# Patient Record
Sex: Female | Born: 1995 | Race: White | Hispanic: No | Marital: Single | Smoking: Never smoker
Health system: Southern US, Community
[De-identification: ages and names within clinical notes are randomized; demographics above are authoritative.]

---
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
## 2016-12-11 ENCOUNTER — Encounter (INDEPENDENT_AMBULATORY_CARE_PROVIDER_SITE_OTHER): Payer: Self-pay | Admitting: Orthopedic Surgery

## 2016-12-11 ENCOUNTER — Ambulatory Visit (INDEPENDENT_AMBULATORY_CARE_PROVIDER_SITE_OTHER): Payer: 59

## 2016-12-11 ENCOUNTER — Telehealth (INDEPENDENT_AMBULATORY_CARE_PROVIDER_SITE_OTHER): Payer: Self-pay | Admitting: Orthopedic Surgery

## 2016-12-11 ENCOUNTER — Ambulatory Visit (INDEPENDENT_AMBULATORY_CARE_PROVIDER_SITE_OTHER): Payer: 59 | Admitting: Orthopedic Surgery

## 2016-12-11 DIAGNOSIS — M25572 Pain in left ankle and joints of left foot: Secondary | ICD-10-CM

## 2016-12-11 NOTE — Progress Notes (Signed)
Office Visit Note   Patient: Wendy Manning           Date of Birth: 1996/02/06           MRN: [id number] Visit Date: 12/11/2016 Requested by: No referring provider defined for this encounter. PCP: Allison QuarryLouise A Twiselton, MD  Subjective: Chief Complaint  Patient presents with  . Left Ankle - Pain, Injury    HPI patient is a 20 year old female with left ankle pain.  She turned it 10/23/2016.  It has kept hurting.  Start her to run is hard for her to walk.  Localizes pain in the lateral ankle region.  Subluxed softball with the initial event.  She's had 2 prior severe ankle sprains on the left-hand side.  She states that the ankle "feel shifting".  She is going to play ultimate Frisbee at [employer] in the spring.  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              Review of Systems All systems reviewed are negative as they relate to the chief complaint within the history of present illness.  Patient denies  fevers or chills.    Assessment & Plan: Visit Diagnoses:  1. Acute left ankle pain     Plan: Impression is left ankle pain status post sprain with persistent symptoms and the possibility of a chondral defect.  Plan is to get an MRI scan of the left ankle to rule out chondral defect.  She's had some degree of injury to the anterior talofibular ligament but it doesn't feel excessively unstable on exam to varus tilt testing or anterior drawer testing.  We'll see her back after that study  Follow-Up Instructions: No Follow-up on file.   Orders:  Orders Placed This Encounter  Procedures  . XR Ankle Complete Left   No orders of the defined types were placed in this encounter.     Procedures: No procedures performed   Clinical Data: No additional findings.  Objective: Vital Signs: There were no vitals taken for this visit.  Physical Exam   Constitutional: Patient appears well-developed HEENT:  Head: Normocephalic Eyes:EOM are normal Neck: Normal range of  motion Cardiovascular: Normal rate Pulmonary/chest: Effort normal Neurologic: Patient is alert Skin: Skin is warm Psychiatric: Patient has normal mood and affect    Ortho Exam examination of the left ankle demonstrates a little swelling around the anterior distal fibula.  She has palpable nontender intact anterior to posterior tib peroneal and Achilles tendons.  She doesn't have much tenderness over the peroneal tendons.  No tenderness at the base of fifth metatarsal on the left.  No pain with pronation supination of the forefoot.  No other masses lymph adenopathy or skin changes noted in the left ankle region.  Specialty Comments:  No specialty comments available.  Imaging: Xr Ankle Complete Left  Result Date: 12/11/2016 AP mortise lateral left ankle reviewed.  Bones normal.  No fracture present.  Mortise is symmetric and aligned.  No soft tissue calcifications    PMFS History: There are no active problems to display for this patient.  No past medical history on file.  No family history on file.  No past surgical history on file. Social History   Occupational History  . Not on file.   Social History Main Topics  . Smoking status: Never Smoker  . Smokeless tobacco: Not on file  . Alcohol use Not on file  . Drug use: Unknown  . Sexual activity: Not  on file

## 2016-12-11 NOTE — Telephone Encounter (Signed)
Done and already scheduled

## 2016-12-11 NOTE — Telephone Encounter (Signed)
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  Can we get the MRI scheduled and review prior to this date?  No appt available @ this time for Dean's schedule.  I told her that you will be following up with her later.

## 2016-12-20 ENCOUNTER — Ambulatory Visit
Admission: RE | Admit: 2016-12-20 | Discharge: 2016-12-20 | Disposition: A | Discharge status: Home / Self Care | Payer: 59 | Source: Ambulatory Visit | Attending: Orthopedic Surgery | Admitting: Orthopedic Surgery

## 2016-12-20 DIAGNOSIS — M25572 Pain in left ankle and joints of left foot: Secondary | ICD-10-CM

## 2016-12-26 ENCOUNTER — Telehealth (INDEPENDENT_AMBULATORY_CARE_PROVIDER_SITE_OTHER): Payer: Self-pay | Admitting: Orthopedic Surgery

## 2016-12-26 NOTE — Telephone Encounter (Signed)
Please see MRI report, pt is calling for results.  No appt is made with you.  Please advise on followup for patient?  Let me know if I need to call her or if you are calling her, thanks.

## 2016-12-26 NOTE — Telephone Encounter (Signed)
Patient requesting the results of MRI on L ankle.  Was done 12/20/16 @ [facility]

## 2016-12-30 NOTE — Telephone Encounter (Signed)
I called the patient.  I think she's probably going to need surgery.  This is a long-standing issue and she is feeling some shifting when she walks around.  Please make an appointment with her to see Dr. Lajoyce Cornersuda on May 15 or mid-May thank you

## 2017-01-02 NOTE — Telephone Encounter (Signed)
IC pt and LMVM to call our office to schedule an appt mid-[place] with Dr Lajoyce Cornersuda per Dr August Saucerean

## 2017-08-04 IMAGING — MR MR ANKLE*L* W/O CM
3 of 5 series · 8 of 40 positions shown · non-contrast
Comparison: Radiographs 12/11/2016

CLINICAL DATA: Persistent left ankle pain related to an ankle
injury playing soccer 2 months ago. Lateral sided ankle pain and
weakness.

EXAM:
MRI OF THE LEFT ANKLE WITHOUT CONTRAST
TECHNIQUE: Multiplanar, multisequence MR imaging of the ankle was performed. No
intravenous contrast was administered.

[Series 3: PD fat-sat · axial · left · 4.0mm · 0.20mm/px · z∈[-18,+78]mm · 3 of 30 slices shown]
[im 5/30]
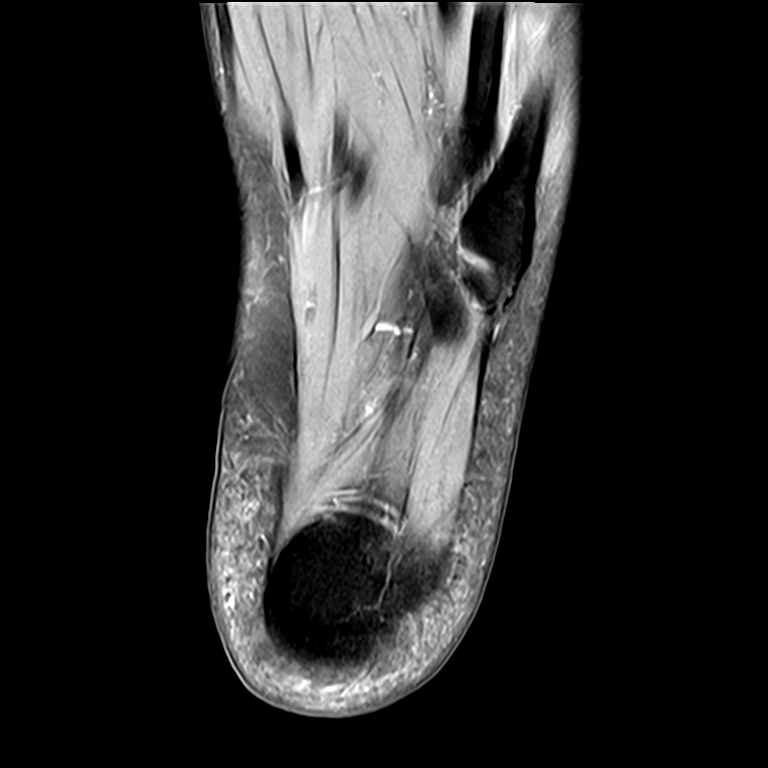
[im 17/30]
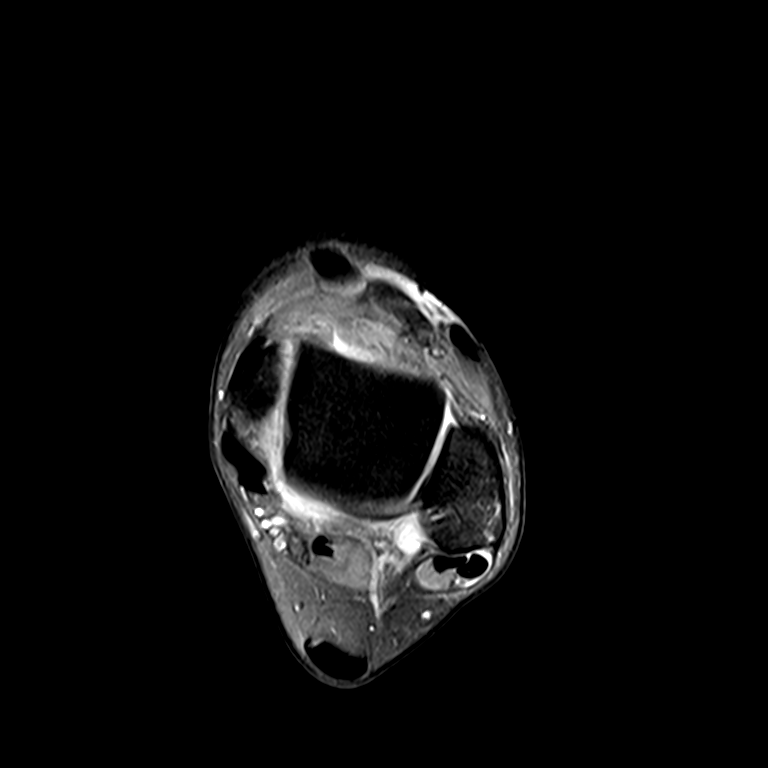
[im 25/30]
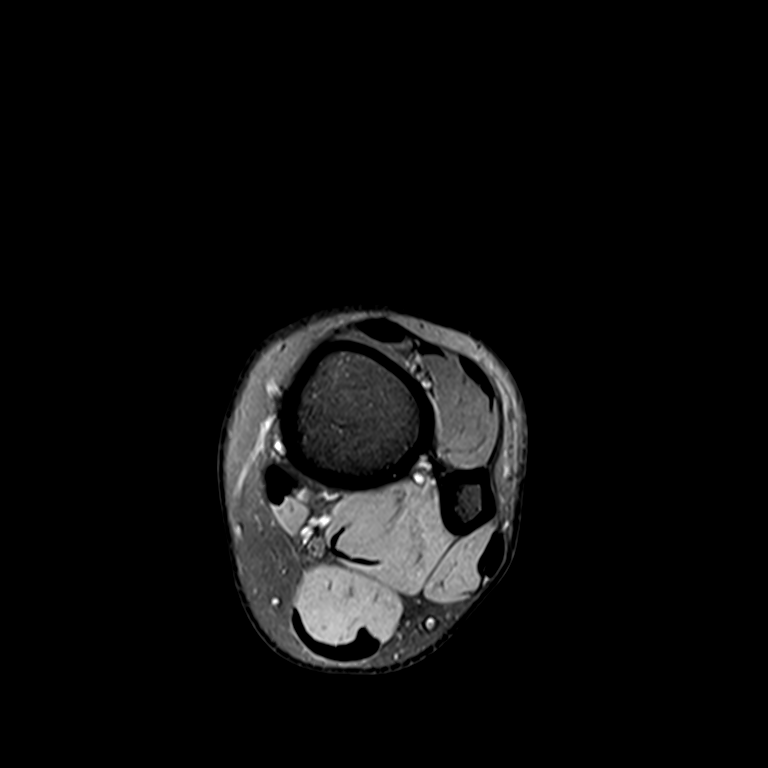

[Series 4: T2 fat-sat · axial · left · 4.0mm · 0.20mm/px · z∈[-22,+83]mm · 3 of 30 slices shown (1 of 2)]
[im 4/30]
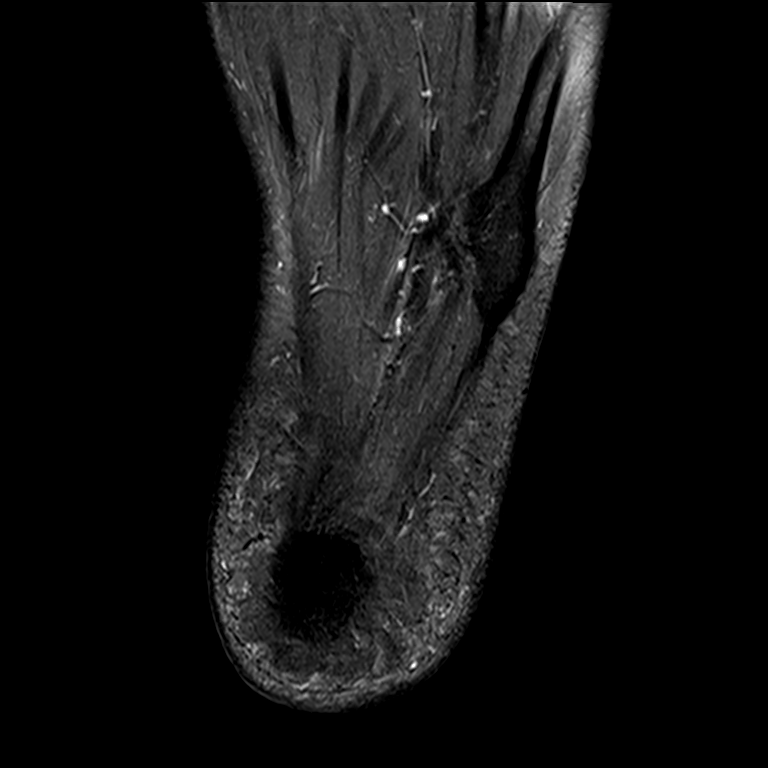
[im 15/30]
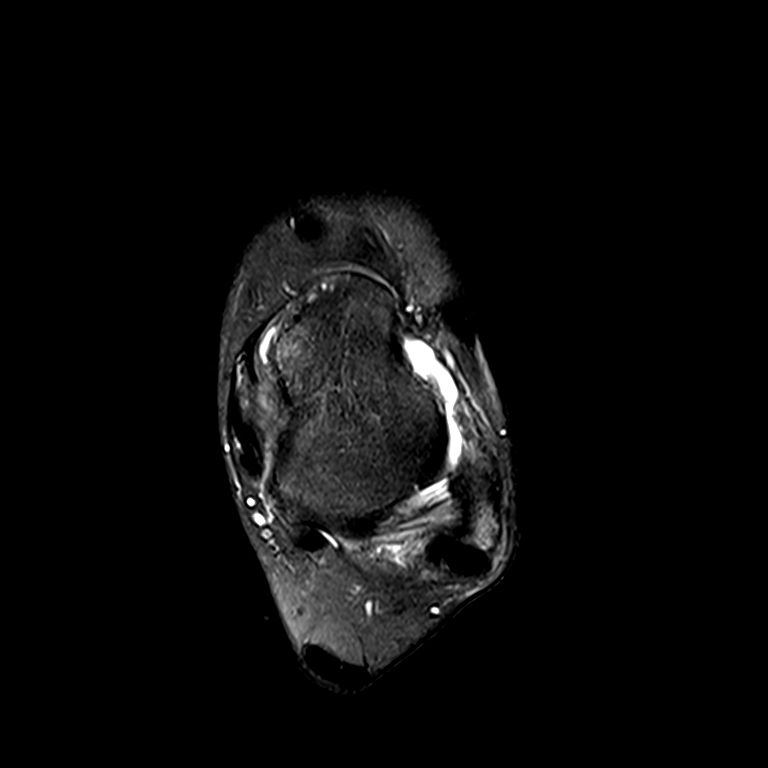
[im 26/30]
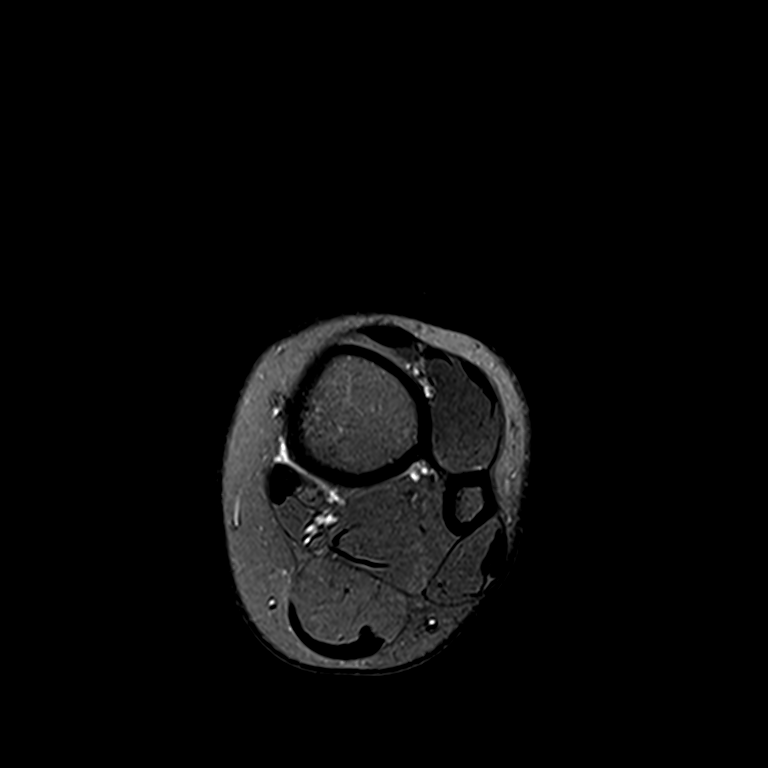

[Series 5: T2 fat-sat · sagittal · left · 3.0mm · 0.21mm/px · 2 of 24 slices shown (2 of 2)]
[im 4/24]
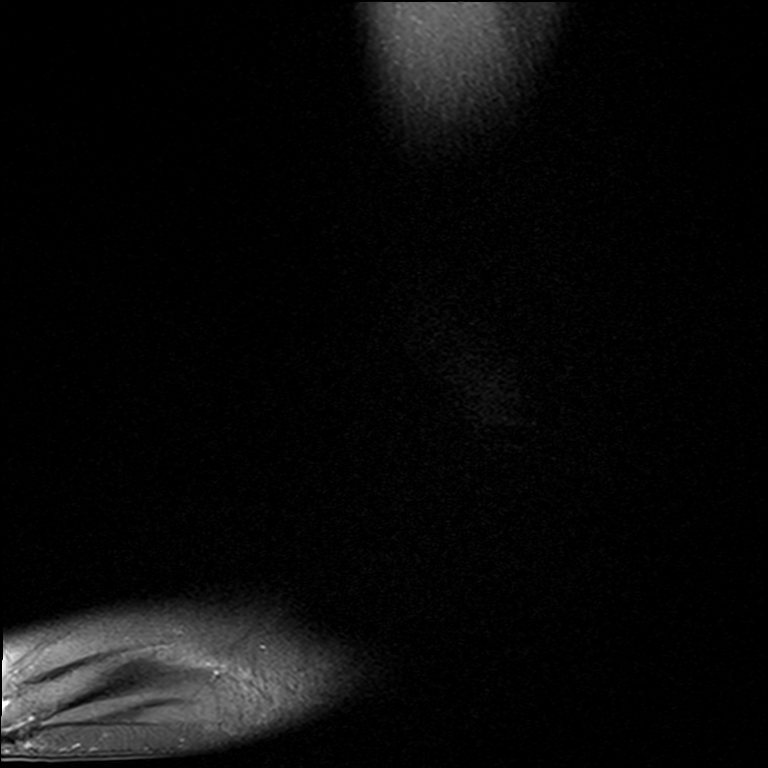
[im 12/24]
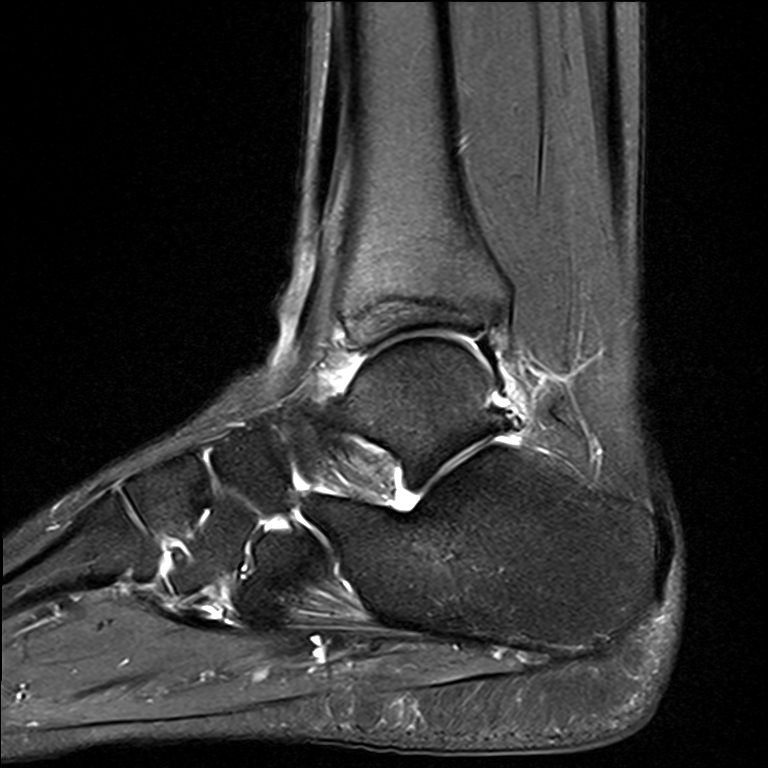

[8 of 40 positions shown; findings below may reference images not displayed]

FINDINGS: TENDONS

Peroneal: Intact. Moderate tenosynovitis involving the peroneal
tendons as they course distal to the distal fibula. Mild
tendinopathy involving the peroneus brevis tendon.

Posteromedial: Intact.

Anterior: Intact.

Achilles: Normal

Plantar Fascia: Intact

LIGAMENTS

Lateral: The anterior and posterior tibiofibular ligaments are
intact.

The anterior talofibular ligament is torn from its talar attachment
site. The posterior talofibular ligament is intact. I only see a
small fragment of the distal aspect of the calcaneofibular ligament
and I suspect it is torn.

Medial: Intact

CARTILAGE

Ankle Joint: Small joint effusion. No chondral defect or
osteochondral lesion.

Subtalar Joints/Sinus Tarsi: The subtalar joints are maintained.
Small joint effusions are noted. The sinus tarsi is normal. The
cervical and interosseous ligaments are intact. The spring ligament
is intact.

Bones: No acute fracture or osteochondral abnormality. Mild edema in
the distal aspect of the fibula could be a contusion or stress
related process.

Other: Knee ankle and foot musculature appears normal.
IMPRESSION: 1. Torn anterior talofibular ligament and calcaneal fibular
ligament. The posterior talofibular ligament is intact and the
anterior posterior tibiofibular ligaments are intact.
2. Tenosynovitis involving the peroneal tendons with some
surrounding likely posttraumatic inflammation.
3. Mild marrow edema involving the distal aspect of the lateral
malleolus likely a bone contusion or stress reaction. No fracture.
4. Intact articular cartilage and no osteochondral abnormality.
5. Small subtalar joint effusions
